# Patient Record
Sex: Male | Born: 1955
Health system: Southern US, Community
[De-identification: ages and names within clinical notes are randomized; demographics above are authoritative.]

## PROBLEM LIST (undated history)

## (undated) DIAGNOSIS — T8859XA Other complications of anesthesia, initial encounter: Secondary | ICD-10-CM

## (undated) DIAGNOSIS — G4730 Sleep apnea, unspecified: Secondary | ICD-10-CM

## (undated) DIAGNOSIS — I10 Essential (primary) hypertension: Secondary | ICD-10-CM

## (undated) DIAGNOSIS — E7800 Pure hypercholesterolemia, unspecified: Secondary | ICD-10-CM

## (undated) DIAGNOSIS — N400 Enlarged prostate without lower urinary tract symptoms: Secondary | ICD-10-CM

## (undated) HISTORY — PX: APPENDECTOMY: SHX54

## (undated) HISTORY — PX: HEMORRHOID SURGERY: SHX153

## (undated) HISTORY — PX: CERVICAL FUSION: SHX112

---

## 2003-02-25 ENCOUNTER — Encounter: Payer: Self-pay | Admitting: Family Medicine

## 2003-02-25 ENCOUNTER — Ambulatory Visit (HOSPITAL_COMMUNITY): Admission: RE | Admit: 2003-02-25 | Discharge: 2003-02-25 | Payer: Self-pay | Admitting: Family Medicine

## 2011-09-13 ENCOUNTER — Other Ambulatory Visit (HOSPITAL_COMMUNITY): Payer: Self-pay | Admitting: Family Medicine

## 2011-09-13 DIAGNOSIS — Z139 Encounter for screening, unspecified: Secondary | ICD-10-CM

## 2011-10-15 ENCOUNTER — Ambulatory Visit (HOSPITAL_COMMUNITY): Payer: Self-pay

## 2021-06-12 ENCOUNTER — Ambulatory Visit (INDEPENDENT_AMBULATORY_CARE_PROVIDER_SITE_OTHER): Payer: Self-pay | Admitting: *Deleted

## 2021-06-12 VITALS — Ht 70.0 in | Wt 250.0 lb

## 2021-06-12 DIAGNOSIS — Z1211 Encounter for screening for malignant neoplasm of colon: Secondary | ICD-10-CM

## 2021-06-12 NOTE — Progress Notes (Signed)
Pt reports having surgeries 22 years ago-removal of internal and external hemorrhoids.

## 2021-06-12 NOTE — Progress Notes (Addendum)
Gastroenterology Pre-Procedure Review  Request Date: 06/12/2021 Requesting Physician: triage per Dr. Gala Romney, no previous TCS  PATIENT REVIEW QUESTIONS: The patient responded to the following health history questions as indicated:    1. Diabetes Melitis: no 2. Joint replacements in the past 12 months: no  3. Major health problems in the past 3 months: yes,vasosyncopy and sleep apnea 4. Has an artificial valve or MVP: no 5. Has a defibrillator: no 6. Has been advised in past to take antibiotics in advance of a procedure like teeth cleaning: no 7. Family history of colon cancer: no 8. Alcohol Use: yes, wine at holidays 9. Illicit drug Use: no 10. History of sleep apnea: yes, BIPAP 11. History of coronary artery or other vascular stents placed within the last 12 months: no 12. 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 Body mass index is 35.87 kg/m.    MEDICATIONS & ALLERGIES:    Patient reports the following regarding taking any blood thinners:   Plavix? no Aspirin? no Coumadin? no Brilinta? no Xarelto? no Eliquis? no Pradaxa? no Savaysa? no Effient? no  Patient confirms/reports the following medications:  Current Outpatient Medications  Medication Sig Dispense Refill   Cholecalciferol (VITAMIN D-3) 125 MCG (5000 UT) TABS Take by mouth daily.     Magnesium 500 MG TABS Take by mouth daily.     Menatetrenone (VITAMIN K2) 100 MCG TABS Take by mouth daily at 6 (six) AM.     rosuvastatin (CRESTOR) 5 MG tablet Take 5 mg by mouth daily.     Zinc 20 MG CAPS Take by mouth daily.     No current facility-administered medications for this visit.    Patient confirms/reports the following allergies:  Allergies  Allergen Reactions   Penicillin G Hives    No orders of the defined types were placed in this encounter.   AUTHORIZATION  INFORMATION Primary Insurance: Medicare,  ID #: [id number] Pre-Cert / Auth required: No, not required  SCHEDULE INFORMATION: Procedure has been scheduled as follows:  Date: 07/17/2021, Time: 2:00 Location: [facility] with Dr. Gala Romney  This Gastroenterology Pre-Precedure Review Form is being routed to the following provider(s): Dr. Gala Romney

## 2021-06-13 MED ORDER — PEG 3350-KCL-NA BICARB-NACL 420 G PO SOLR: 4000.0000 mL | Freq: Once | ORAL | 0 refills | Status: AC

## 2021-06-13 NOTE — Progress Notes (Signed)
Went over all prep instructions by phone.  Faxed a copy to Dr. Cindie Laroche at [phone] as requested.  He requested me to call him a couple of days before his procedure to remind him of it.  Added him to my schedule accordingly.

## 2021-06-13 NOTE — Addendum Note (Signed)
Addended by: Metro Kung on: 06/13/2021 03:15 PM   Modules accepted: Orders

## 2021-06-13 NOTE — Progress Notes (Signed)
Rourk, Cristopher Estimable, MD  Metro Kung, RMA Okay to schedule.  I will call him an ASA 2/propofol     Noted.  Pt scheduled for 07/17/2021 with arrival at 12:30.  Reviewed all instructions with pt and faxed to him per request.

## 2021-06-13 NOTE — Patient Instructions (Addendum)
Andres Rivas   1956/04/09 MRN: [id number]    Procedure Date: 07/17/2021 Time to register: 12:30 Place to register: [facility] Scheduled provider: Dr. Gala Romney  PREPARATION FOR COLONOSCOPY WITH TRI-LYTE SPLIT PREP  Please notify us immediately if you are diabetic, take iron supplements, or if you are on Coumadin or any other blood thinners.   Weight loss medications must be held 7 days prior to your procedure.   Please notify us of any medication changes at least 7 days prior to your procedure.   Please hold the following medications: n/a  You will need to purchase 1 fleet enema and 1 box of Bisacodyl $RemoveBefo'5mg'cpjJlOZPoBs$  tablets.   2 DAYS BEFORE PROCEDURE:  DATE: 07/15/2021   DAY: Saturday Begin clear liquid diet AFTER your lunch meal. NO SOLID FOODS after this point.  1 DAY BEFORE PROCEDURE:  DATE: 07/16/2021   DAY: Sunday Continue clear liquids the entire day - NO SOLID FOOD.   Diabetic medications adjustments for today: n/a  At 2:00 pm:  Take 2 Bisacodyl tablets.   At 4:00pm:  Start drinking your solution. Make sure you mix well per instructions on the bottle. Try to drink 1 (one) 8 ounce glass every 10-15 minutes until you have consumed HALF the jug. You should complete by 6:00pm.You must keep the left over solution refrigerated until completed next day.  Continue clear liquids. You must drink plenty of clear liquids to prevent dehyration and kidney failure.     DAY OF PROCEDURE:   DATE: 07/17/2021   DAY: Monday If you take medications for your heart, blood pressure or breathing, you may take these medications.  Diabetic medications adjustments for today: n/a  Five hours before your procedure time @ 9:00 am:  Finish remaining amout of bowel prep, drinking 1 (one) 8 ounce glass every 10-15 minutes until complete. You have two hours to consume remaining prep.   Three hours before your procedure time @ 11:00 am:  Nothing by mouth.   At least one hour before going to the  hospital:  Give yourself one Fleet enema. You may take your morning medications with sip of water unless we have instructed otherwise.       Please see below for Dietary Information.  CLEAR LIQUIDS INCLUDE:  Water Jello (NOT red in color)   Ice Popsicles (NOT red in color)   Tea (sugar ok, no milk/cream) Powdered fruit flavored drinks  Coffee (sugar ok, no milk/cream) Gatorade/ Lemonade/ Kool-Aid  (NOT red in color)   Juice: apple, white grape, white cranberry Soft drinks  Clear bullion, consomme, broth (fat free beef/chicken/vegetable)  Carbonated beverages (any kind)  Strained chicken noodle soup Hard Candy   Remember: Clear liquids are liquids that will allow you to see your fingers on the other side of a clear glass. Be sure liquids are NOT red in color, and not cloudy, but CLEAR.  DO NOT EAT OR DRINK ANY OF THE FOLLOWING:  Dairy products of any kind   Cranberry juice Tomato juice / V8 juice   Grapefruit juice Orange juice     Red grape juice  Do not eat any solid foods, including such foods as: cereal, oatmeal, yogurt, fruits, vegetables, creamed soups, eggs, bread, crackers, pureed foods in a blender, etc.   HELPFUL HINTS FOR DRINKING PREP SOLUTION:  Make sure prep is extremely cold. Mix and refrigerate the the morning of the prep. You may also put in the freezer.  You may try mixing some Crystal Light or  Country Time Lemonade if you prefer. Mix in small amounts; add more if necessary. Try drinking through a straw Rinse mouth with water or a mouthwash between glasses, to remove after-taste. Try sipping on a cold beverage /ice/ popsicles between glasses of prep. Place a piece of sugar-free hard candy in mouth between glasses. If you become nauseated, try consuming smaller amounts, or stretch out the time between glasses. Stop for 30-60 minutes, then slowly start back drinking.        OTHER INSTRUCTIONS   You will need to have a responsible adult immediately available  after your procedure to receive discharge instructions then drive you home. We strongly encourage your responsible adult to remain in the hospital during your procedure.  Your procedure will be canceled if a responsible adult is not available.  Wear loose fitting clothing that is easily removed. Leave jewelry and other valuables at home.  Remove all body piercing jewelry and leave at home. Total time from sign-in until discharge is approximately 2-3 hours. You should go home directly after your procedure and rest. You can resume normal activities the day after your procedure. The day of your procedure you should not: Drive Make legal decisions Operate machinery Drink alcohol Return to work   You may call the office (Dept: [phone]) before 5:00pm, or page the doctor on call [phone]) after 5:00pm, for further instructions, if necessary.   Insurance Information YOU WILL NEED TO CHECK WITH YOUR INSURANCE COMPANY FOR THE BENEFITS OF COVERAGE YOU HAVE FOR THIS PROCEDURE.  UNFORTUNATELY, NOT ALL INSURANCE COMPANIES HAVE BENEFITS TO COVER ALL OR PART OF THESE TYPES OF PROCEDURES.  IT IS YOUR RESPONSIBILITY TO CHECK YOUR BENEFITS, HOWEVER, WE WILL BE GLAD TO ASSIST YOU WITH ANY CODES YOUR INSURANCE COMPANY MAY NEED.    PLEASE NOTE THAT MOST INSURANCE COMPANIES WILL NOT COVER A SCREENING COLONOSCOPY FOR PEOPLE UNDER THE AGE OF 50  IF YOU HAVE BCBS INSURANCE, YOU MAY HAVE BENEFITS FOR A SCREENING COLONOSCOPY BUT IF POLYPS ARE FOUND THE DIAGNOSIS WILL CHANGE AND THEN YOU MAY HAVE A DEDUCTIBLE THAT WILL NEED TO BE MET. SO PLEASE MAKE SURE YOU CHECK YOUR BENEFITS FOR A SCREENING COLONOSCOPY AS WELL AS A DIAGNOSTIC COLONOSCOPY.

## 2021-07-14 ENCOUNTER — Ambulatory Visit: Payer: Medicare Other

## 2021-07-17 ENCOUNTER — Ambulatory Visit (HOSPITAL_COMMUNITY)
Admission: RE | Admit: 2021-07-17 | Discharge: 2021-07-17 | Disposition: A | Discharge status: Home / Self Care | Payer: Medicare Other | Attending: Internal Medicine | Admitting: Internal Medicine

## 2021-07-17 ENCOUNTER — Encounter (HOSPITAL_COMMUNITY)
Admission: RE | Disposition: A | Discharge status: Home / Self Care | Payer: Self-pay | Source: Home / Self Care | Attending: Internal Medicine

## 2021-07-17 ENCOUNTER — Ambulatory Visit (HOSPITAL_COMMUNITY): Payer: Medicare Other | Admitting: Anesthesiology

## 2021-07-17 ENCOUNTER — Other Ambulatory Visit: Payer: Self-pay

## 2021-07-17 ENCOUNTER — Encounter (HOSPITAL_COMMUNITY): Payer: Self-pay | Admitting: Internal Medicine

## 2021-07-17 DIAGNOSIS — D123 Benign neoplasm of transverse colon: Secondary | ICD-10-CM | POA: Insufficient documentation

## 2021-07-17 DIAGNOSIS — Z87891 Personal history of nicotine dependence: Secondary | ICD-10-CM | POA: Diagnosis not present

## 2021-07-17 DIAGNOSIS — Z79899 Other long term (current) drug therapy: Secondary | ICD-10-CM | POA: Insufficient documentation

## 2021-07-17 DIAGNOSIS — K621 Rectal polyp: Secondary | ICD-10-CM | POA: Diagnosis not present

## 2021-07-17 DIAGNOSIS — Z1211 Encounter for screening for malignant neoplasm of colon: Secondary | ICD-10-CM | POA: Insufficient documentation

## 2021-07-17 DIAGNOSIS — K635 Polyp of colon: Secondary | ICD-10-CM

## 2021-07-17 DIAGNOSIS — K5730 Diverticulosis of large intestine without perforation or abscess without bleeding: Secondary | ICD-10-CM | POA: Diagnosis not present

## 2021-07-17 HISTORY — PX: COLONOSCOPY WITH PROPOFOL: SHX5780

## 2021-07-17 HISTORY — DX: Other complications of anesthesia, initial encounter: T88.59XA

## 2021-07-17 HISTORY — PX: POLYPECTOMY: SHX5525

## 2021-07-17 HISTORY — DX: Essential (primary) hypertension: I10

## 2021-07-17 HISTORY — DX: Sleep apnea, unspecified: G47.30

## 2021-07-17 HISTORY — DX: Pure hypercholesterolemia, unspecified: E78.00

## 2021-07-17 SURGERY — COLONOSCOPY WITH PROPOFOL
Anesthesia: General

## 2021-07-17 MED ORDER — STERILE WATER FOR IRRIGATION IR SOLN
Status: DC | PRN
  Administered 2021-07-17: 100 mL

## 2021-07-17 MED ORDER — LACTATED RINGERS IV SOLN: INTRAVENOUS | Status: DC

## 2021-07-17 MED ORDER — LIDOCAINE HCL (CARDIAC) PF 50 MG/5ML IV SOSY
PREFILLED_SYRINGE | INTRAVENOUS | Status: DC | PRN
Start: 2021-07-17 — End: 2021-07-17
  Administered 2021-07-17: 50 mg via INTRAVENOUS

## 2021-07-17 MED ORDER — PROPOFOL 10 MG/ML IV BOLUS
INTRAVENOUS | Status: DC | PRN
  Administered 2021-07-17: 20 mg via INTRAVENOUS
  Administered 2021-07-17: 100 mg via INTRAVENOUS
  Administered 2021-07-17: 30 mg via INTRAVENOUS
  Administered 2021-07-17: 150 mg via INTRAVENOUS
  Administered 2021-07-17: 100 mg via INTRAVENOUS

## 2021-07-17 NOTE — Discharge Instructions (Addendum)
  Colonoscopy Discharge Instructions  Read the instructions outlined below and refer to this sheet in the next few weeks. These discharge instructions provide you with general information on caring for yourself after you leave the hospital. Your doctor may also give you specific instructions. While your treatment has been planned according to the most current medical practices available, unavoidable complications occasionally occur. If you have any problems or questions after discharge, call Dr. Gala Romney at [phone]. ACTIVITY You may resume your regular activity, but move at a slower pace for the next 24 hours.  Take frequent rest periods for the next 24 hours.  Walking will help get rid of the air and reduce the bloated feeling in your belly (abdomen).  No driving for 24 hours (because of the medicine (anesthesia) used during the test).   Do not sign any important legal documents or operate any machinery for 24 hours (because of the anesthesia used during the test).  NUTRITION Drink plenty of fluids.  You may resume your normal diet as instructed by your doctor.  Begin with a light meal and progress to your normal diet. Heavy or fried foods are harder to digest and may make you feel sick to your stomach (nauseated).  Avoid alcoholic beverages for 24 hours or as instructed.  MEDICATIONS You may resume your normal medications unless your doctor tells you otherwise.  WHAT YOU CAN EXPECT TODAY Some feelings of bloating in the abdomen.  Passage of more gas than usual.  Spotting of blood in your stool or on the toilet paper.  IF YOU HAD POLYPS REMOVED DURING THE COLONOSCOPY: No aspirin products for 7 days or as instructed.  No alcohol for 7 days or as instructed.  Eat a soft diet for the next 24 hours.  FINDING OUT THE RESULTS OF YOUR TEST Not all test results are available during your visit. If your test results are not back during the visit, make an appointment with your caregiver to find out the  results. Do not assume everything is normal if you have not heard from your caregiver or the medical facility. It is important for you to follow up on all of your test results.  SEEK IMMEDIATE MEDICAL ATTENTION IF: You have more than a spotting of blood in your stool.  Your belly is swollen (abdominal distention).  You are nauseated or vomiting.  You have a temperature over 101.  You have abdominal pain or discomfort that is severe or gets worse throughout the day.     Pancolonic diverticulosis found  2 small polyps removed-1 from the splenic flexure and one from the rectum  Further recommendations to follow pending review of pathology report

## 2021-07-17 NOTE — Transfer of Care (Signed)
Immediate Anesthesia Transfer of Care Note  Patient: Andres Rivas  Procedure(s) Performed: COLONOSCOPY WITH PROPOFOL POLYPECTOMY  Patient Location: Endoscopy Unit  Anesthesia Type:General  Level of Consciousness: awake and patient cooperative  Airway & Oxygen Therapy: Patient Spontanous Breathing  Post-op Assessment: Report given to RN and Post -op Vital signs reviewed and stable  Post vital signs: Reviewed and stable  Last Vitals:  Vitals Value Taken Time  BP    Temp    Pulse    Resp    SpO2      Last Pain:  Vitals:   07/17/21 1313  TempSrc: Oral  PainSc: 0-No pain      Patients Stated Pain Goal: 10 (97/67/34 1937)  Complications: No notable events documented.

## 2021-07-17 NOTE — Anesthesia Postprocedure Evaluation (Signed)
Anesthesia Post Note  Patient: Andres Rivas  Procedure(s) Performed: COLONOSCOPY WITH PROPOFOL POLYPECTOMY  Patient location during evaluation: Phase II Anesthesia Type: General Level of consciousness: awake Pain management: pain level controlled Vital Signs Assessment: post-procedure vital signs reviewed and stable Respiratory status: spontaneous breathing and respiratory function stable Cardiovascular status: blood pressure returned to baseline and stable Postop Assessment: no headache and no apparent nausea or vomiting Anesthetic complications: no Comments: Late entry   No notable events documented.   Last Vitals:  Vitals:   07/17/21 1313 07/17/21 1401  BP: 138/72 (!) 99/48  Pulse: 80 79  Resp: (!) 22 19  Temp: 36.6 C 36.7 C  SpO2: 95% 93%    Last Pain:  Vitals:   07/17/21 1401  TempSrc: Oral  PainSc: 0-No pain                 Louann Sjogren

## 2021-07-17 NOTE — Anesthesia Preprocedure Evaluation (Signed)

## 2021-07-17 NOTE — Anesthesia Procedure Notes (Signed)
Date/Time: 07/17/2021 1:29 PM Performed by: Vista Deck, CRNA Pre-anesthesia Checklist: Patient identified, Emergency Drugs available, Suction available, Timeout performed and Patient being monitored Patient Re-evaluated:Patient Re-evaluated prior to induction Oxygen Delivery Method: Nasal Cannula

## 2021-07-17 NOTE — Op Note (Signed)
[facility] Patient Name: Andres Rivas Procedure Date: 07/17/2021 12:54 PM MRN: [id number] Date of Birth: [birth year] Attending MD: Norvel Richards , MD CSN: [id number] Age: 65 Admit Type: Outpatient Procedure:                Colonoscopy Indications:              Screening for colorectal malignant neoplasm Providers:                Norvel Richards, MD, Rosina Lowenstein, RN, Raphael Gibney, Technician Referring MD:              Medicines:                Propofol per Anesthesia Complications:            No immediate complications. Estimated Blood Loss:     Estimated blood loss was minimal. Procedure:                Pre-Anesthesia Assessment:                           - Prior to the procedure, a History and Physical                            was performed, and patient medications and                            allergies were reviewed. The patient's tolerance of                            previous anesthesia was also reviewed. The risks                            and benefits of the procedure and the sedation                            options and risks were discussed with the patient.                            All questions were answered, and informed consent                            was obtained. Prior Anticoagulants: The patient has                            taken no previous anticoagulant or antiplatelet                            agents. ASA Grade Assessment: II - A patient with                            mild systemic disease. After reviewing the risks  and benefits, the patient was deemed in                            satisfactory condition to undergo the procedure.                           After obtaining informed consent, the colonoscope                            was passed under direct vision. Throughout the                            procedure, the patient's blood pressure, pulse, and                             oxygen saturations were monitored continuously. The                            [id number]) scope was introduced through the                            anus and advanced to the the cecum, identified by                            appendiceal orifice and ileocecal valve. The                            colonoscopy was performed without difficulty. The                            patient tolerated the procedure well. The quality                            of the bowel preparation was adequate. The entire                            colon was well visualized. Scope In: 1:36:15 PM Scope Out: 1:57:19 PM Scope Withdrawal Time: 0 hours 14 minutes 2 seconds  Total Procedure Duration: 0 hours 21 minutes 4 seconds  Findings:      The perianal and digital rectal examinations were normal aside from anal       papilla.      Scattered small and large-mouthed diverticula were found in the entire       colon. There was no evidence of diverticular bleeding.      Two sessile polyps were found in the rectum, mid rectum and splenic       flexure. The polyps were 4 to 5 mm in size. These polyps were removed       with a cold snare. Resection and retrieval were complete. Estimated       blood loss was minimal.      The exam was otherwise without abnormality on direct and retroflexion       views. Impression:               - Diverticulosis in the entire examined colon.  There was no evidence of diverticular bleeding.                           - Two 4 to 5 mm polyps in the rectum, in the mid                            rectum and at the splenic flexure, removed with a                            cold snare. Resected and retrieved. Anal papilla.                           - The examination was otherwise normal on direct                            and retroflexion views. Moderate Sedation:      Moderate (conscious) sedation was personally administered by an       anesthesia  professional. The following parameters were monitored: oxygen       saturation, heart rate, blood pressure, respiratory rate, EKG, adequacy       of pulmonary ventilation, and response to care. Recommendation:           - Discharge patient to home.                           - Patient has a contact number available for                            emergencies. The signs and symptoms of potential                            delayed complications were discussed with the                            patient. Return to normal activities tomorrow.                            Written discharge instructions were provided to the                            patient.                           - Advance diet as tolerated.                           - Continue present medications.                           - Repeat colonoscopy date to be determined after                            pending pathology results are reviewed for  surveillance.                           - Return to GI office (date not yet determined). Procedure Code(s):        --- Professional ---                           [phone], Colonoscopy, flexible; with removal of                            tumor(s), polyp(s), or other lesion(s) by snare                            technique Diagnosis Code(s):        --- Professional ---                           Z12.11, Encounter for screening for malignant                            neoplasm of colon                           K62.1, Rectal polyp                           K63.5, Polyp of colon                           K57.30, Diverticulosis of large intestine without                            perforation or abscess without bleeding CPT copyright 2019 American Medical Association. All rights reserved. The codes documented in this report are preliminary and upon coder review may  be revised to meet current compliance requirements. Cristopher Estimable. Djimon Lundstrom, MD Norvel Richards,  MD 07/17/2021 2:08:05 PM This report has been signed electronically. Number of Addenda: 0

## 2021-07-17 NOTE — H&P (Signed)
@LOGO @   Primary Care Physician:  System, Provider Not In Primary Gastroenterologist:  Dr. Gala Romney  Pre-Procedure History & Physical: HPI:  Andres Rivas is a 65 y.o. male is here for a screening colonoscopy.   No bowel symptoms.  No family history of colon cancer.  No prior colonoscopy  Past Medical History:  Diagnosis Date   Complication of anesthesia    High cholesterol    Hypertension    Sleep apnea     Past Surgical History:  Procedure Laterality Date   APPENDECTOMY     CERVICAL FUSION     x3   HEMORRHOID SURGERY     x2    Prior to Admission medications   Medication Sig Start Date End Date Taking? Authorizing Provider  amLODipine (NORVASC) 10 MG tablet Take 10 mg by mouth daily.   Yes [provider]  Cholecalciferol (VITAMIN D-3) 125 MCG (5000 UT) TABS Take 5,000 Units by mouth daily.   Yes [provider]  Magnesium 500 MG TABS Take 500 mg by mouth daily.   Yes [provider]  Menatetrenone (VITAMIN K2) 100 MCG TABS Take 100 mcg by mouth daily.   Yes [provider]  miconazole (MICOTIN) 2 % cream Apply 1 application topically daily as needed (groin).   Yes [provider]  rosuvastatin (CRESTOR) 10 MG tablet Take 10 mg by mouth daily.   Yes [provider]  tamsulosin (FLOMAX) 0.4 MG CAPS capsule Take 0.4 mg by mouth daily.   Yes [provider]  Zinc 20 MG CAPS Take 20 mg by mouth daily.   Yes [provider]    Allergies as of 06/13/2021 - Review Complete 06/12/2021  Allergen Reaction Noted   Penicillin g Hives 07/15/2018    History reviewed. No pertinent family history.  Social History   Socioeconomic History   Marital status: Married    Spouse name: Not on file   Number of children: Not on file   Years of education: Not on file   Highest education level: Not on file  Occupational History   Not on file  Tobacco Use   Smoking status: Former    Types: Cigarettes   Smokeless  tobacco: Never  Vaping Use   Vaping Use: Never used  Substance and Sexual Activity   Alcohol use: Not on file   Drug use: Not on file   Sexual activity: Not on file  Other Topics Concern   Not on file  Social History Narrative   Not on file   Social Determinants of Health   Financial Resource Strain: Not on file  Food Insecurity: Not on file  Transportation Needs: Not on file  Physical Activity: Not on file  Stress: Not on file  Social Connections: Not on file  Intimate Partner Violence: Not on file    Review of Systems: See HPI, otherwise negative ROS  Physical Exam: Ht 5\' 10"  (1.778 m)   Wt 122.5 kg   BMI 38.74 kg/m  General:   Alert,  Well-developed, well-nourished, pleasant and cooperative in NAD Neck:  Supple; no masses or thyromegaly. Lungs:  Clear throughout to auscultation.   No wheezes, crackles, or rhonchi. No acute distress. Heart:  Regular rate and rhythm; no murmurs, clicks, rubs,  or gallops. Abdomen:  Soft, nontender and nondistended. No masses, hepatosplenomegaly or hernias noted. Normal bowel sounds, without guarding, and without rebound.    Impression/Plan: Andres Rivas is now here to undergo a screening colonoscopy.  First ever  average risk screening examination  Risks, benefits, limitations, imponderables and alternatives regarding colonoscopy have been reviewed with the patient. Questions have been answered. All parties agreeable.     Notice:  This dictation was prepared with Dragon dictation along with smaller phrase technology. Any transcriptional errors that result from this process are unintentional and may not be corrected upon review.

## 2021-07-19 ENCOUNTER — Encounter: Payer: Self-pay | Admitting: Internal Medicine

## 2021-07-19 LAB — SURGICAL PATHOLOGY

## 2021-07-20 ENCOUNTER — Encounter (HOSPITAL_COMMUNITY): Payer: Self-pay | Admitting: Internal Medicine

## 2021-08-21 ENCOUNTER — Emergency Department (HOSPITAL_COMMUNITY): Payer: Medicare Other

## 2021-08-21 ENCOUNTER — Emergency Department (HOSPITAL_COMMUNITY)
Admission: EM | Admit: 2021-08-21 | Discharge: 2021-08-21 | Disposition: A | Discharge status: Home / Self Care | Payer: Medicare Other | Attending: Emergency Medicine | Admitting: Emergency Medicine

## 2021-08-21 ENCOUNTER — Other Ambulatory Visit: Payer: Self-pay

## 2021-08-21 ENCOUNTER — Encounter (HOSPITAL_COMMUNITY): Payer: Self-pay | Admitting: Emergency Medicine

## 2021-08-21 DIAGNOSIS — Z79899 Other long term (current) drug therapy: Secondary | ICD-10-CM | POA: Diagnosis not present

## 2021-08-21 DIAGNOSIS — Z87891 Personal history of nicotine dependence: Secondary | ICD-10-CM | POA: Diagnosis not present

## 2021-08-21 DIAGNOSIS — I10 Essential (primary) hypertension: Secondary | ICD-10-CM | POA: Insufficient documentation

## 2021-08-21 DIAGNOSIS — S299XXA Unspecified injury of thorax, initial encounter: Secondary | ICD-10-CM | POA: Diagnosis present

## 2021-08-21 DIAGNOSIS — W11XXXA Fall on and from ladder, initial encounter: Secondary | ICD-10-CM | POA: Diagnosis not present

## 2021-08-21 DIAGNOSIS — S2242XA Multiple fractures of ribs, left side, initial encounter for closed fracture: Secondary | ICD-10-CM | POA: Diagnosis not present

## 2021-08-21 MED ORDER — MELOXICAM 15 MG PO TABS: 15.0000 mg | ORAL_TABLET | Freq: Every day | ORAL | 0 refills | Status: AC

## 2021-08-21 MED ORDER — KETOROLAC TROMETHAMINE 30 MG/ML IJ SOLN
30.0000 mg | Freq: Once | INTRAMUSCULAR | Status: AC
  Administered 2021-08-21: 30 mg via INTRAMUSCULAR
  Filled 2021-08-21: qty 1

## 2021-08-21 MED ORDER — OXYCODONE-ACETAMINOPHEN 5-325 MG PO TABS
1.0000 | ORAL_TABLET | Freq: Four times a day (QID) | ORAL | 0 refills | Status: AC | PRN
Start: 2021-08-21 — End: ?

## 2021-08-21 NOTE — ED Triage Notes (Signed)
Pt fell 4 ft from ladder x 90 mins ago. C/o left posterior rib pain and sob. Denies hitting head/loc.

## 2021-08-21 NOTE — Discharge Instructions (Addendum)
Use the incentive spirometer to help your breathing.  See your doctor as needed for problems.  Return here if needed.

## 2021-08-21 NOTE — ED Provider Notes (Signed)
[facility] Provider Note   CSN: [id number] Arrival date & time: 08/21/21  1635     History Chief Complaint  Patient presents with   Andres Rivas is a 65 y.o. male.  HPI He complains of an injury to his left ribs, when he fell from a ladder about 18 inches EPIC, striking his left ribs on a chair arm.  He was able ambulate afterwards and drove his vehicle here for evaluation.  He he denies shortness of breath, abdominal pain, headache, neck pain or lower back pain.Marland Kitchen  He does not take anticoagulants.  There are no other known active modifying factors.    Past Medical History:  Diagnosis Date   Complication of anesthesia    High cholesterol    Hypertension    Sleep apnea     There are no problems to display for this patient.   Past Surgical History:  Procedure Laterality Date   APPENDECTOMY     CERVICAL FUSION     x3   COLONOSCOPY WITH PROPOFOL N/A 07/17/2021   Procedure: COLONOSCOPY WITH PROPOFOL;  Surgeon: Daneil Dolin, MD;  [facility]: [facility];  Service: Endoscopy;  Laterality: N/A;  2:00 / ASA II   HEMORRHOID SURGERY     x2   POLYPECTOMY  07/17/2021   Procedure: POLYPECTOMY;  Surgeon: Daneil Dolin, MD;  Location: [facility];  Service: Endoscopy;;       History reviewed. No pertinent family history.  Social History   Tobacco Use   Smoking status: Former    Types: Cigarettes   Smokeless tobacco: Never  Vaping Use   Vaping Use: Never used    Home Medications Prior to Admission medications   Medication Sig Start Date End Date Taking? Authorizing Provider  meloxicam (MOBIC) 15 MG tablet Take 1 tablet (15 mg total) by mouth daily. 08/21/21  Yes Daleen Bo, MD  oxyCODONE-acetaminophen (PERCOCET/ROXICET) 5-325 MG tablet Take 1 tablet by mouth every 6 (six) hours as needed for severe pain. 08/21/21  Yes Daleen Bo, MD  amLODipine (NORVASC) 10 MG tablet Take 10 mg by mouth daily.    [provider]   Cholecalciferol (VITAMIN D-3) 125 MCG (5000 UT) TABS Take 5,000 Units by mouth daily.    [provider]  Magnesium 500 MG TABS Take 500 mg by mouth daily.    [provider]  Menatetrenone (VITAMIN K2) 100 MCG TABS Take 100 mcg by mouth daily.    [provider]  miconazole (MICOTIN) 2 % cream Apply 1 application topically daily as needed (groin).    [provider]  rosuvastatin (CRESTOR) 10 MG tablet Take 10 mg by mouth daily.    [provider]  tamsulosin (FLOMAX) 0.4 MG CAPS capsule Take 0.4 mg by mouth daily.    [provider]  Zinc 20 MG CAPS Take 20 mg by mouth daily.    [provider]    Allergies    Penicillin g  Review of Systems   Review of Systems  All other systems reviewed and are negative.  Physical Exam Updated Vital Signs BP (!) 142/90 (BP Location: Left Arm)   Pulse 78   Temp 98.4 F (36.9 C) (Oral)   Resp 19   Ht 5\' 10"  (1.778 m)   Wt 124.7 kg   SpO2 95%   BMI 39.46 kg/m   Physical Exam Vitals and nursing note reviewed.  Constitutional:      General: He  is not in acute distress.    Appearance: He is well-developed. He is not ill-appearing, toxic-appearing or diaphoretic.  HENT:     Head: Normocephalic and atraumatic.     Right Ear: External ear normal.     Left Ear: External ear normal.  Eyes:     Conjunctiva/sclera: Conjunctivae normal.     Pupils: Pupils are equal, round, and reactive to light.  Neck:     Trachea: Phonation normal.  Cardiovascular:     Rate and Rhythm: Normal rate.  Pulmonary:     Effort: Pulmonary effort is normal. No respiratory distress.     Breath sounds: No stridor.  Chest:     Chest wall: Tenderness (Tender left posterior chest wall, without crepitation or deformity.  No chest wall instability.) present.  Abdominal:     Palpations: Abdomen is soft.     Tenderness: There is no abdominal tenderness.  Musculoskeletal:        General: Normal range of  motion.     Cervical back: Normal range of motion and neck supple.  Skin:    General: Skin is warm and dry.  Neurological:     General: No focal deficit present.     Mental Status: He is alert and oriented to person, place, and time.     Cranial Nerves: No cranial nerve deficit.     Sensory: No sensory deficit.     Motor: No abnormal muscle tone.     Coordination: Coordination normal.  Psychiatric:        Mood and Affect: Mood normal.        Behavior: Behavior normal.        Thought Content: Thought content normal.        Judgment: Judgment normal.    ED Results / Procedures / Treatments   Labs (all labs ordered are listed, but only abnormal results are displayed) Labs Reviewed - No data to display  EKG None  Radiology DG Ribs Unilateral W/Chest Left  Result Date: 08/21/2021 CLINICAL DATA:  Fall. EXAM: LEFT RIBS AND CHEST - 3+ VIEW COMPARISON:  None. FINDINGS: There is bibasilar atelectasis. There is no pleural effusion or pneumothorax. The cardiomediastinal silhouette is within normal limits. There are acute lateral left ninth, tenth and eleventh rib fractures. Cervical spinal fusion plate is present. IMPRESSION: 1.  Acute left ninth, tenth and eleventh rib fractures. 2.  Bibasilar atelectasis. 3.  No pneumothorax. Electronically Signed   By: Ronney Asters M.D.   On: 08/21/2021 17:26    Procedures Procedures   Medications Ordered in ED Medications  ketorolac (TORADOL) 30 MG/ML injection 30 mg (30 mg Intramuscular Given 08/21/21 1800)    ED Course  I have reviewed the triage vital signs and the nursing notes.  Pertinent labs & imaging results that were available during my care of the patient were reviewed by me and considered in my medical decision making (see chart for details).    MDM Rules/Calculators/A&P                            Patient Vitals for the past 24 hrs:  BP Temp Temp src Pulse Resp SpO2 Height Weight  08/21/21 2024 (!) 142/90 98.4 F (36.9 C) Oral  78 19 95 % -- --  08/21/21 1830 (!) 140/91 -- -- 82 19 95 % -- --  08/21/21 1815 -- -- -- 83 -- 94 % -- --  08/21/21 1800 (!) 165/90 -- --  82 -- 96 % -- --  08/21/21 1745 -- -- -- 84 -- 96 % -- --  08/21/21 1730 (!) 145/76 -- -- 85 -- 98 % -- --  08/21/21 1705 (!) 157/85 -- -- 84 (!) 21 95 % -- --  08/21/21 1643 -- -- -- -- -- -- 5\' 10"  (1.778 m) 124.7 kg  08/21/21 1642 (!) 168/97 98.7 F (37.1 C) -- 93 20 94 % -- --    At the time of discharge- reevaluation with update and discussion. After initial assessment and treatment, an updated evaluation reveals he is ambulating comfortably. Daleen Bo   Medical Decision Making:  This patient is presenting for evaluation of injuries from fall, which does require a range of treatment options, and is a complaint that involves a moderate risk of morbidity and mortality. The differential diagnoses include contusion, fracture. I decided to review old records, and in summary elderly male, accidental fall while putting items into an attic.  He was able ambulate afterwards.  Complains of pain only in the left posterior chest wall.  I did not additional historical information from anyone.   Radiologic Tests Ordered, included chest x-ray and left rib detail.  I independently Visualized: Radiograph images, which show nondisplaced fractures, left ribs 05/27/2010    Critical Interventions-clinical evaluation, radiography, medication treatment, observation and reassessment  After These Interventions, the Patient was reevaluated and was found stable for discharge.  Rib fracture x3 without suspected thoracic or visceral internal injuries.  Stable for discharge with symptomatic treatment.  CRITICAL CARE-no Performed by: Daleen Bo  Nursing Notes Reviewed/ Care Coordinated Applicable Imaging Reviewed Interpretation of Laboratory Data incorporated into ED treatment  The patient appears reasonably screened and/or stabilized for discharge and I doubt  any other medical condition or other [facility] requiring further screening, evaluation, or treatment in the ED at this time prior to discharge.  Plan: Home Medications-continue usual; Home Treatments-rest, gradually increase activity; return here if the recommended treatment, does not improve the symptoms; Recommended follow up-PCP, as needed     Final Clinical Impression(s) / ED Diagnoses Final diagnoses:  Closed fracture of multiple ribs of left side, initial encounter    Rx / DC Orders ED Discharge Orders          Ordered    oxyCODONE-acetaminophen (PERCOCET/ROXICET) 5-325 MG tablet  Every 6 hours PRN        08/21/21 1958    meloxicam (MOBIC) 15 MG tablet  Daily        08/21/21 1958             Daleen Bo, MD 08/22/21 1258

## 2022-10-03 IMAGING — DX DG RIBS W/ CHEST 3+V*L*
5 series · 5 of 5 positions shown · non-contrast
Comparison: None.

CLINICAL DATA: Fall.

EXAM:
LEFT RIBS AND CHEST - 3+ VIEW

[chest pa (1 of 2)]
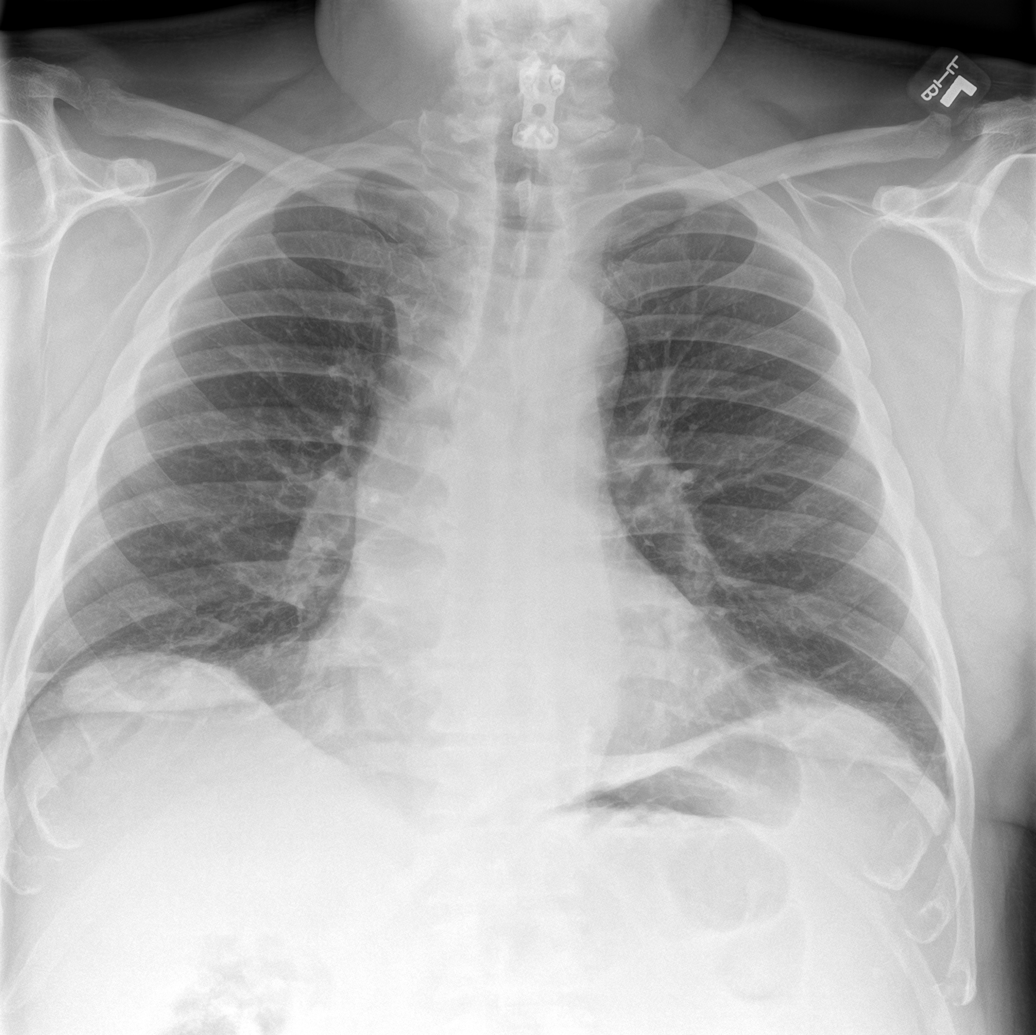

[rib pa obl (1 of 2)]
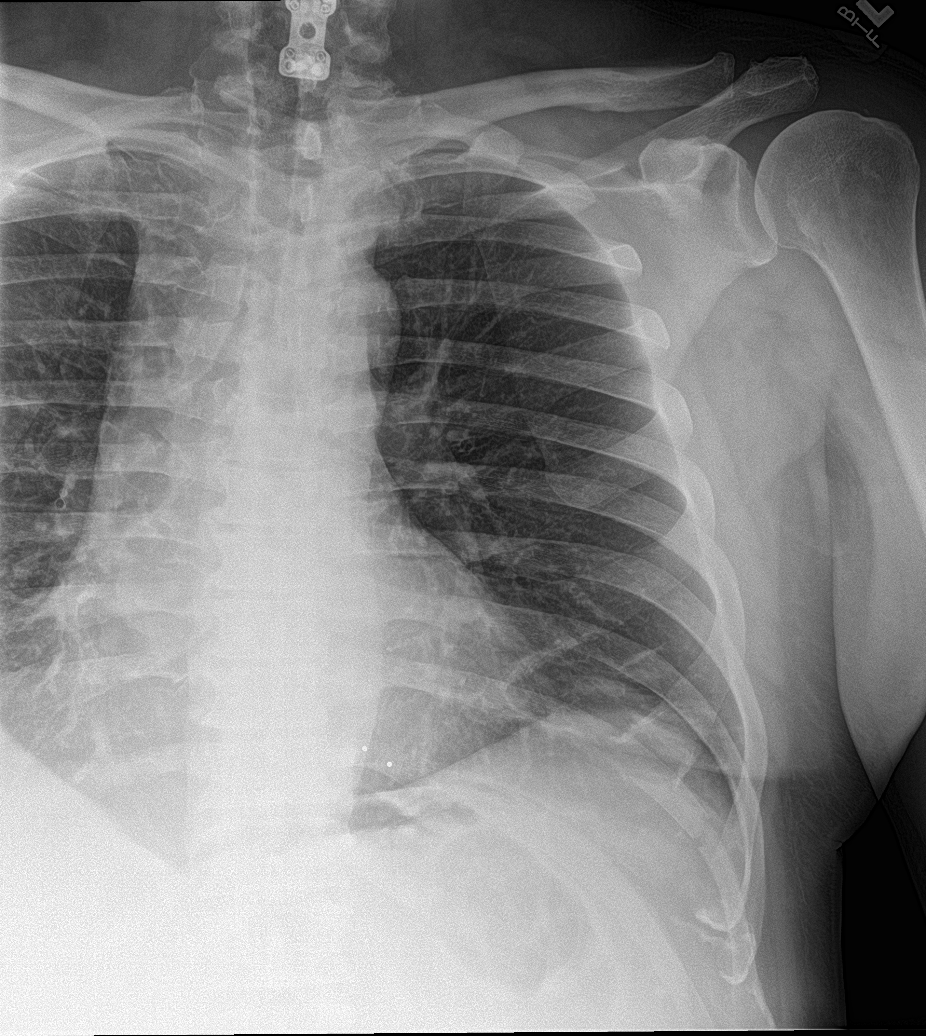

[rib pa obl (2 of 2)]
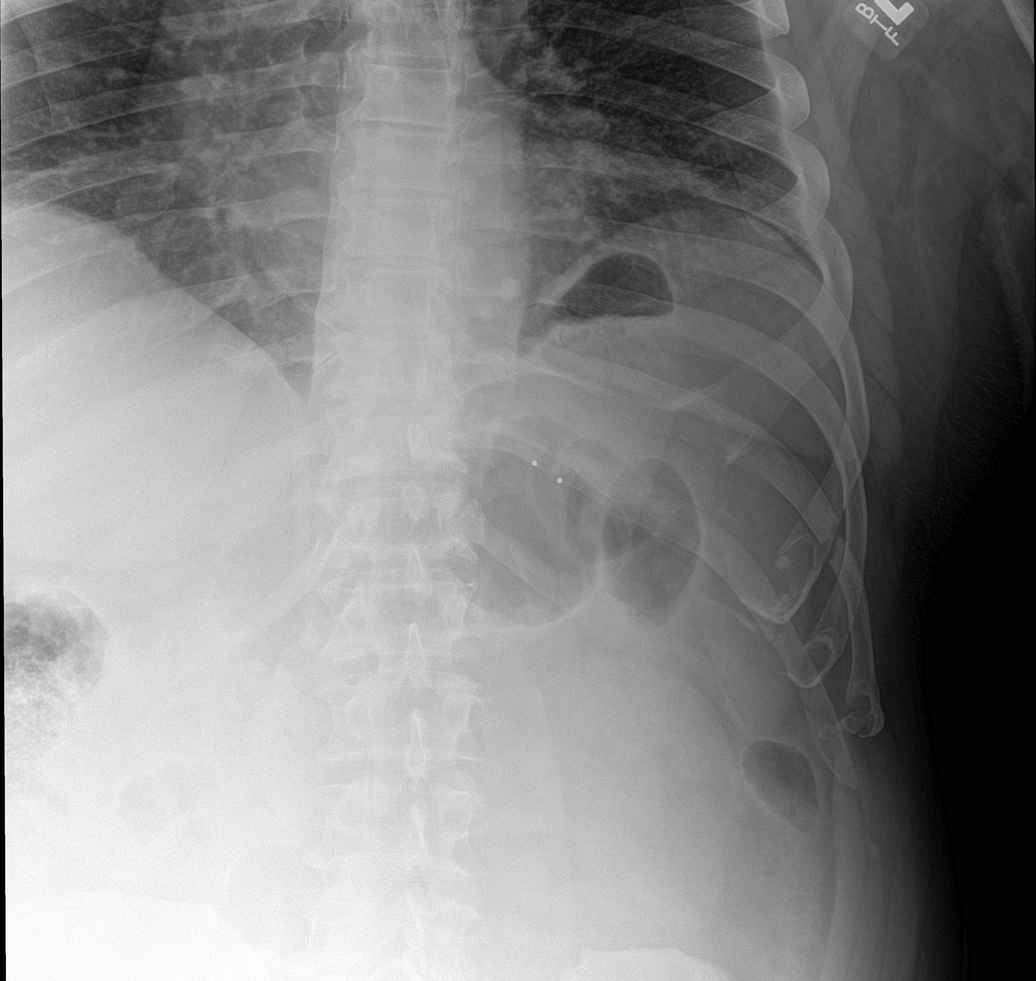

[rib pa]
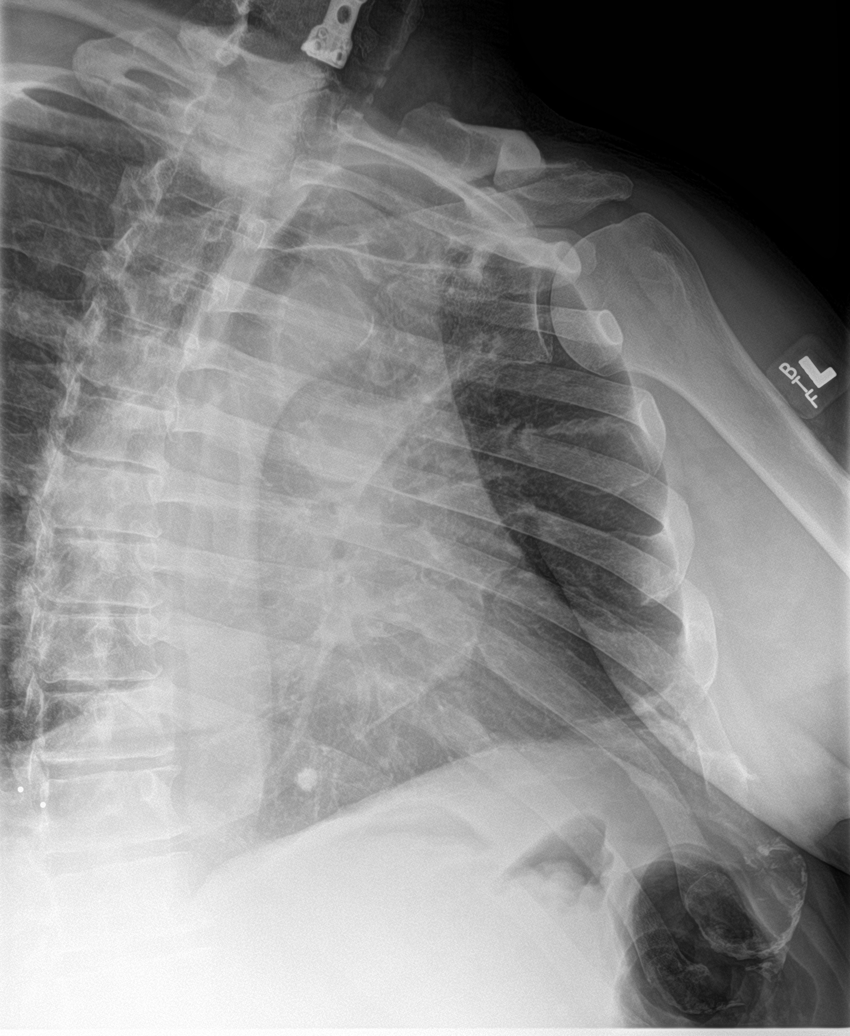

[chest pa (2 of 2)]
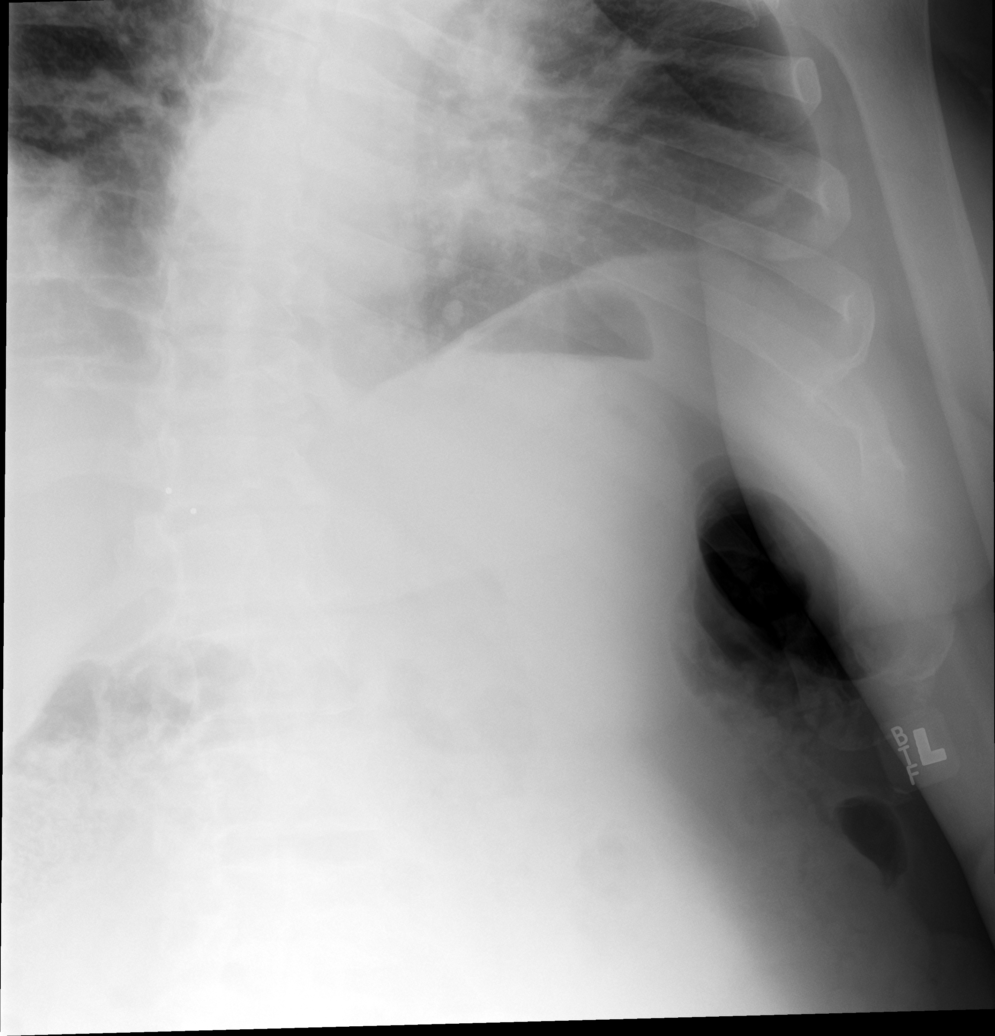

[5 of 5 positions shown; findings below may reference images not displayed]

FINDINGS: There is bibasilar atelectasis. There is no pleural effusion or
pneumothorax. The cardiomediastinal silhouette is within normal
limits.

There are acute lateral left ninth, tenth and eleventh rib
fractures. Cervical spinal fusion plate is present.
IMPRESSION: 1.  Acute left ninth, tenth and eleventh rib fractures.

2.  Bibasilar atelectasis.

3.  No pneumothorax.

## 2024-10-16 ENCOUNTER — Other Ambulatory Visit: Payer: Self-pay

## 2024-10-16 ENCOUNTER — Emergency Department (HOSPITAL_COMMUNITY)

## 2024-10-16 ENCOUNTER — Emergency Department (HOSPITAL_COMMUNITY)
Admission: EM | Admit: 2024-10-16 | Discharge: 2024-10-16 | Disposition: A | Discharge status: Home / Self Care | Attending: Emergency Medicine | Admitting: Emergency Medicine

## 2024-10-16 ENCOUNTER — Encounter (HOSPITAL_COMMUNITY): Payer: Self-pay | Admitting: Emergency Medicine

## 2024-10-16 DIAGNOSIS — K5900 Constipation, unspecified: Secondary | ICD-10-CM | POA: Insufficient documentation

## 2024-10-16 DIAGNOSIS — R911 Solitary pulmonary nodule: Secondary | ICD-10-CM | POA: Diagnosis not present

## 2024-10-16 DIAGNOSIS — R1084 Generalized abdominal pain: Secondary | ICD-10-CM | POA: Diagnosis present

## 2024-10-16 DIAGNOSIS — K5790 Diverticulosis of intestine, part unspecified, without perforation or abscess without bleeding: Secondary | ICD-10-CM | POA: Diagnosis not present

## 2024-10-16 HISTORY — DX: Benign prostatic hyperplasia without lower urinary tract symptoms: N40.0

## 2024-10-16 LAB — COMPREHENSIVE METABOLIC PANEL WITH GFR
ALT: 15 U/L (ref 0–44)
AST: 25 U/L (ref 15–41)
Albumin: 4.6 g/dL (ref 3.5–5.0)
Alkaline Phosphatase: 63 U/L (ref 38–126)
Anion gap: 20 — ABNORMAL HIGH (ref 5–15)
BUN: 12 mg/dL (ref 8–23)
CO2: 19 mmol/L — ABNORMAL LOW (ref 22–32)
Calcium: 9.3 mg/dL (ref 8.9–10.3)
Chloride: 101 mmol/L (ref 98–111)
Creatinine, Ser: 1.04 mg/dL (ref 0.61–1.24)
GFR, Estimated: >60 mL/min
Glucose, Bld: 109 mg/dL — ABNORMAL HIGH (ref 70–99)
Potassium: 4.2 mmol/L (ref 3.5–5.1)
Sodium: 140 mmol/L (ref 135–145)
Total Bilirubin: 0.9 mg/dL (ref 0.0–1.2)
Total Protein: 7.5 g/dL (ref 6.5–8.1)

## 2024-10-16 LAB — CBC WITH DIFFERENTIAL/PLATELET
Abs Immature Granulocytes: 0.02 10*3/uL (ref 0.00–0.07)
Basophils Absolute: 0 10*3/uL (ref 0.0–0.1)
Basophils Relative: 1 %
Eosinophils Absolute: 0.1 10*3/uL (ref 0.0–0.5)
Eosinophils Relative: 2 %
HCT: 38.2 % — ABNORMAL LOW (ref 39.0–52.0)
Hemoglobin: 13.5 g/dL (ref 13.0–17.0)
Immature Granulocytes: 0 %
Lymphocytes Relative: 13 %
Lymphs Abs: 1.1 10*3/uL (ref 0.7–4.0)
MCH: 32.1 pg (ref 26.0–34.0)
MCHC: 35.3 g/dL (ref 30.0–36.0)
MCV: 91 fL (ref 80.0–100.0)
Monocytes Absolute: 0.8 10*3/uL (ref 0.1–1.0)
Monocytes Relative: 10 %
Neutro Abs: 6.4 10*3/uL (ref 1.7–7.7)
Neutrophils Relative %: 74 %
Platelets: 201 10*3/uL (ref 150–400)
RBC: 4.2 MIL/uL — ABNORMAL LOW (ref 4.22–5.81)
RDW: 12.8 % (ref 11.5–15.5)
WBC: 8.5 10*3/uL (ref 4.0–10.5)
nRBC: 0 % (ref 0.0–0.2)

## 2024-10-16 LAB — LIPASE, BLOOD: Lipase: 20 U/L (ref 11–51)

## 2024-10-16 MED ORDER — HYDROCODONE-ACETAMINOPHEN 5-325 MG PO TABS
1.0000 | ORAL_TABLET | Freq: Once | ORAL | Status: AC
  Administered 2024-10-16: 1 via ORAL
  Filled 2024-10-16: qty 1

## 2024-10-16 MED ORDER — HYDROMORPHONE HCL 1 MG/ML IJ SOLN
1.0000 mg | Freq: Once | INTRAMUSCULAR | Status: AC
  Administered 2024-10-16: 1 mg via INTRAVENOUS
  Filled 2024-10-16: qty 1

## 2024-10-16 MED ORDER — MILK AND MOLASSES ENEMA
1.0000 | Freq: Once | RECTAL | Status: AC
  Administered 2024-10-16: 150 mL via RECTAL
  Filled 2024-10-16: qty 150

## 2024-10-16 MED ORDER — GOLYTELY 236 G PO SOLR: 4000.0000 mL | Freq: Once | ORAL | 0 refills | Status: AC

## 2024-10-16 MED ORDER — LORAZEPAM 2 MG/ML IJ SOLN
0.5000 mg | Freq: Once | INTRAMUSCULAR | Status: AC
  Administered 2024-10-16: 0.5 mg via INTRAVENOUS
  Filled 2024-10-16: qty 1

## 2024-10-16 MED ORDER — SODIUM CHLORIDE 0.9 % IV BOLUS
500.0000 mL | Freq: Once | INTRAVENOUS | Status: AC
  Administered 2024-10-16: 500 mL via INTRAVENOUS

## 2024-10-16 MED ORDER — IOHEXOL 300 MG/ML  SOLN
100.0000 mL | Freq: Once | INTRAMUSCULAR | Status: AC | PRN
  Administered 2024-10-16: 100 mL via INTRAVENOUS

## 2024-10-16 MED ORDER — KETOROLAC TROMETHAMINE 30 MG/ML IJ SOLN
15.0000 mg | Freq: Once | INTRAMUSCULAR | Status: AC
  Administered 2024-10-16: 15 mg via INTRAVENOUS
  Filled 2024-10-16: qty 1

## 2024-10-16 NOTE — ED Provider Notes (Signed)
 " Provider Note MRN:  [id number]  Arrival date & time: 10/16/24    ED Course and Medical Decision Making  Assumed care from Dr Suzette at shift change.  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  Abdominal pain is improved.  No nausea or vomiting. [SG]    Clinical Course User Index [SG] Elnor Jayson LABOR, DO   Discussed constipation management at home.  Will prescribe laxative.  Aggressive bowel meant for home.  Increase fluid intake.  Follow-up PCP  11:12 PM:  I have discussed the diagnosis/risks/treatment options with the patient.  Evaluation and diagnostic testing in the emergency department does not suggest an emergent condition requiring admission or immediate intervention beyond what has been performed at this time.  They will follow up with pcp. We also discussed returning to the ED immediately if new or worsening sx occur. We discussed the sx which are most concerning (e.g., sudden worsening pain, fever, inability to tolerate by mouth) that necessitate immediate return.    The patient appears reasonably screened and/or stabilized for discharge and I doubt any other medical condition or other [facility] requiring further screening, evaluation, or treatment in the ED at this time prior to discharge.      Procedures  Final Clinical Impressions(s) / ED Diagnoses     ICD-10-CM   1. Constipation, unspecified constipation type  K59.00     2. Lung nodule  R91.1     3. Diverticulosis  K57.90       ED Discharge Orders          Ordered    polyethylene glycol (GOLYTELY ) 236 g solution   Once        10/16/24 2310              Discharge Instructions      It was a pleasure caring for you today in the emergency department.  There was a nodule  incidentally noted on your imaging in your lung.  Please follow with your PCP for repeat imaging in 12 months  Please follow up with your primary care doctor within 2-3 days. For constipation we also recommend a diet high in fiber (beans, fruits, vegetables, whole grains). Take Colace 100-200 mg up to three times per day. You may take along with Senokot 1-2 tabs, ingest with full glass of water .  You may also take MiraLAX 1-2 capfuls 1-2 times a day until stools become soft and then slowly decrease the amount of MiraLAX used.  Maintain fluid intake 6-8 glasses per day. Please increase fibers in your diet. You may also take Milk of Magnesia 30 mL as needed for constipation, you may repeat in 2 hours again if no bowl movement.   Please return to the emergency department for any worsening or worrisome symptoms.     Providers Accepting New Patients in [place], [place]     [facility] [address]  [place], [place] [address] [phone] Accepts most insurances  [facility] [address] [place], [place] [address] [phone] Accepts most insurances  [facility] [address] [place], [place] [address]  [phone] Must meet requirements  [facility] [address] [place], [place] [address] (  [phone] Accepts most insurances  [facility] [address]  [place], [place] [address] [phone] Accepts most insurances  [facility] [address]   [place], [place]   [phone] Accepts most insurances  [facility])  [phone] [address]  [place], [place] [address] [phone] Accepts most insurances     [facility] [address]  [place], [place] [address] [phone] Accepts most insurances  [facility] Department [address] [place], [place] [address] [phone] option 1 Accepts  Medicaid and [facility] Internal Medicine [address]  [place], [place] [address] [phone] Accepts most insurances  Benita Outhouse, MD [address] [place], [place] [address] [phone] Accepts most insurances  [facility] at [facility] [address]  [place], [place] [address] [phone] Accepts most insurances  [facility] [phone] [address] [place], [place] [address] [phone] Accepts most insurances  [place], [place] [address] [place], [place] [address] [phone]  Accepts most insurances       Elnor Jayson LABOR, DO 10/16/24 2312  "

## 2024-10-16 NOTE — ED Triage Notes (Signed)
 Pt c/o constipation x 1week. States has tried all several enemas and manual disimpaction. Pt having some labored breathing due to pain. A/o. Only small amounts of brown liquid has been coming out in the last 7 days. Pt normal bowel habits are once a day. Abd tender but soft.

## 2024-10-16 NOTE — ED Notes (Signed)
 Waiting for materials for enema

## 2024-10-16 NOTE — Discharge Instructions (Addendum)
 It was a pleasure caring for you today in the emergency department.  There was a nodule incidentally noted on your imaging in your lung.  Please follow with your PCP for repeat imaging in 12 months  Please follow up with your primary care doctor within 2-3 days. For constipation we also recommend a diet high in fiber (beans, fruits, vegetables, whole grains). Take Colace 100-200 mg up to three times per day. You may take along with Senokot 1-2 tabs, ingest with full glass of water .  You may also take MiraLAX 1-2 capfuls 1-2 times a day until stools become soft and then slowly decrease the amount of MiraLAX used.  Maintain fluid intake 6-8 glasses per day. Please increase fibers in your diet. You may also take Milk of Magnesia 30 mL as needed for constipation, you may repeat in 2 hours again if no bowl movement.   Please return to the emergency department for any worsening or worrisome symptoms.     Providers Accepting New Patients in [place], [place]    [facility] [address]  [place], [place] [address] [phone] Accepts most insurances  [facility] [address] [place], [place] [address] [phone] Accepts most insurances  [facility] [address] [place], [place] [address]  [phone] Must meet requirements  [facility] [address] [place], [place] [address] [phone] Accepts most insurances  [facility] [address]  [place], [place] [address] [phone] Accepts most insurances  [facility] [address]   [place], [place]   [phone] Accepts most insurances  [facility])  [phone]  [place], [place] [address] [phone] Accepts most insurances     [facility] [address]  [place], [place] [address] [phone] Accepts most insurances  [facility] Department [address] [place], [place] [address] [phone] option 1 Accepts Medicaid and [facility] Internal Medicine [address]  [place], [place] [address] [phone] Accepts most insurances  Benita Outhouse, MD [address] [place], [place] [address] [phone] Accepts most insurances  [facility] at [facility] [address]  [place], [place] [address] [phone] Accepts most insurances  [facility] [phone] [address] [place], [place] [address] [phone] Accepts most insurances  [place], [place] [address], [address] [place], [place] [address] [phone]  Accepts most insurances

## 2024-10-16 NOTE — ED Provider Notes (Signed)
 " [place] EMERGENCY DEPARTMENT AT [facility] Provider Note   CSN: [id number] Arrival date & time: 10/16/24  0930     Patient presents with: Abdominal Pain   Andres Rivas is a 69 y.o. male.  {Add pertinent medical, surgical, social history, OB history to HPI:32947} Complains of constipation.  He has tried a couple enemas without himself.   Abdominal Pain      Prior to Admission medications  Medication Sig Start Date End Date Taking? Authorizing Provider  amLODipine (NORVASC) 10 MG tablet Take 10 mg by mouth daily.    [provider]  Cholecalciferol (VITAMIN D-3) 125 MCG (5000 UT) TABS Take 5,000 Units by mouth daily.    [provider]  Magnesium 500 MG TABS Take 500 mg by mouth daily.    [provider]  meloxicam  (MOBIC ) 15 MG tablet Take 1 tablet (15 mg total) by mouth daily. 08/21/21   Lorriane Holmes, MD  Menatetrenone (VITAMIN K2) 100 MCG TABS Take 100 mcg by mouth daily.    [provider]  miconazole (MICOTIN) 2 % cream Apply 1 application topically daily as needed (groin).    [provider]  oxyCODONE -acetaminophen  (PERCOCET/ROXICET) 5-325 MG tablet Take 1 tablet by mouth every 6 (six) hours as needed for severe pain. 08/21/21   Lorriane Holmes, MD  rosuvastatin (CRESTOR) 10 MG tablet Take 10 mg by mouth daily.    [provider]  tamsulosin (FLOMAX) 0.4 MG CAPS capsule Take 0.4 mg by mouth daily.    [provider]  Zinc 20 MG CAPS Take 20 mg by mouth daily.    [provider]    Allergies: Penicillin g    Review of Systems  Gastrointestinal:  Positive for abdominal pain.    Updated Vital Signs BP (!) 157/99   Pulse 93   Temp 97.8 F (36.6 C) (Oral)   Resp 20   SpO2 93%   Physical Exam  (all labs ordered are listed, but only abnormal results are displayed) Labs Reviewed  CBC WITH DIFFERENTIAL/PLATELET - Abnormal; Notable for the following components:      Result  Value   RBC 4.20 (*)    HCT 38.2 (*)    All other components within normal limits  COMPREHENSIVE METABOLIC PANEL WITH GFR - Abnormal; Notable for the following components:   CO2 19 (*)    Glucose, Bld 109 (*)    Anion gap 20 (*)    All other components within normal limits  LIPASE, BLOOD    EKG: None  Radiology: CT ABDOMEN PELVIS W CONTRAST Result Date: 10/16/2024 CLINICAL DATA:  Abdominal pain, acute, nonlocalized.  Constipation. EXAM: CT ABDOMEN AND PELVIS WITH CONTRAST TECHNIQUE: Multidetector CT imaging of the abdomen and pelvis was performed using the standard protocol following bolus administration of intravenous contrast. RADIATION DOSE REDUCTION: This exam was performed according to the departmental dose-optimization program which includes automated exposure control, adjustment of the mA and/or kV according to patient size and/or use of iterative reconstruction technique. CONTRAST:  OMNIPAQUE  IOHEXOL  300 MG/ML  SOLN COMPARISON:  None Available. FINDINGS: Lower chest: 4 mm subpleural nodule in the right lower lobe on image 3, sequence 8. No acute abnormality at the lung bases. Calcified granuloma in the left lower lobe on image 7. Hepatobiliary: Normal appearance of the liver, gallbladder and portal venous system. No biliary dilatation. Pancreas: Unremarkable. No pancreatic ductal dilatation or surrounding inflammatory changes. Spleen: Normal in size without focal abnormality. Adrenals/Urinary Tract: Normal adrenal  glands. 6.1 cm cyst in left kidney lower pole that does not require dedicated follow-up. No suspicious renal lesions. No hydronephrosis. Urinary bladder is decompressed. Stomach/Bowel: Normal stomach. Mild distention of the rectum containing stool. Mild rectal wall thickening with mild stranding or prominent vascular structures around the rectum. Scattered colonic diverticula without acute colonic inflammation. No bowel dilatation or obstruction. Vascular/Lymphatic: Aortic  atherosclerosis. No enlarged abdominal or pelvic lymph nodes. Reproductive: Prostate is unremarkable. Other: No free fluid. Negative for free air. Bilateral inguinal hernias containing fat. Musculoskeletal: Old fractures involving the left eleventh, tenth and ninth ribs. 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 IMPRESSION: 1. Rectum is mildly distended with stool. Mild rectal wall thickening. Findings are nonspecific but could be associated with chronic constipation or mild proctitis. Small to moderate stool burden in the colon. 2. Colonic diverticulosis without acute colonic inflammation. 3. 4 mm subpleural nodule in the right lower lobe. No follow-up needed if patient is low-risk. Non-contrast chest CT can be considered in 12 months if patient is high-risk. This recommendation follows the consensus statement: Guidelines for Management of Incidental Pulmonary Nodules Detected on CT Images: From the Fleischner Society 2017; Radiology 2017; 284:228-243. 4. Aortic atherosclerosis. Electronically Signed   By: Juliene Balder M.D.   On: 10/16/2024 15:17    {Document cardiac monitor, telemetry assessment procedure when appropriate:32947} Procedures   Medications Ordered in the ED  milk and molasses enema (has no administration in time range)  sodium chloride  0.9 % bolus 500 mL (500 mLs Intravenous Bolus from Bag 10/16/24 1152)  ketorolac  (TORADOL ) 30 MG/ML injection 15 mg (15 mg Intravenous Given 10/16/24 1151)  iohexol  (OMNIPAQUE ) 300 MG/ML solution 100 mL (100 mLs Intravenous Contrast Given 10/16/24 1339)  HYDROmorphone  (DILAUDID ) injection 1 mg (1 mg Intravenous Given 10/16/24 1442)  LORazepam  (ATIVAN ) injection 0.5 mg (0.5 mg Intravenous Given 10/16/24 1440)    Clinical Course as of 10/16/24 1538  Fri Oct 16, 2024  1529 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 Radiology: ordered.  Risk Prescription drug management.   Constipation.  {Document critical care time when appropriate  Document review of labs and clinical decision tools ie CHADS2VASC2, etc  Document your independent review of radiology images and any outside records  Document your discussion with family members, caretakers and with consultants  Document social determinants of health affecting pt's care  Document your decision making why or why not admission, treatments were needed:32947:::1}   Final diagnoses:  None    ED Discharge Orders     None        "

## 2024-10-16 NOTE — ED Notes (Signed)
 Enema completed, unable to have BM. Pt complaining of pain. Provider Elnor aware.

## 2024-10-17 ENCOUNTER — Telehealth (HOSPITAL_COMMUNITY): Payer: Self-pay | Admitting: Emergency Medicine

## 2024-10-17 MED ORDER — GOLYTELY 236 G PO SOLR: 4000.0000 mL | Freq: Once | ORAL | 0 refills | Status: AC

## 2024-10-17 MED ORDER — GOLYTELY 236 G PO SOLR: 4000.0000 mL | Freq: Once | ORAL | 0 refills | Status: DC

## 2024-10-17 NOTE — Telephone Encounter (Signed)
 Pt called back asking for a different pharmacy

## 2024-10-17 NOTE — Telephone Encounter (Signed)
 Pt requested pharmacy change.
